# Patient Record
Sex: Male | Born: 1975 | Race: Black or African American | Hispanic: No | Marital: Married | Smoking: Never smoker
Health system: Southern US, Community
[De-identification: ages and names within clinical notes are randomized; demographics above are authoritative.]

## PROBLEM LIST (undated history)

## (undated) DIAGNOSIS — K589 Irritable bowel syndrome without diarrhea: Secondary | ICD-10-CM

---

## 2018-04-15 ENCOUNTER — Ambulatory Visit (INDEPENDENT_AMBULATORY_CARE_PROVIDER_SITE_OTHER): Payer: 59 | Admitting: Family Medicine

## 2018-04-15 ENCOUNTER — Encounter: Payer: Self-pay | Admitting: Family Medicine

## 2018-04-15 VITALS — BP 123/78 | HR 79 | Ht 73.0 in | Wt 185.0 lb

## 2018-04-15 DIAGNOSIS — M25512 Pain in left shoulder: Secondary | ICD-10-CM

## 2018-04-15 MED ORDER — DICLOFENAC SODIUM 75 MG PO TBEC: 75.0000 mg | DELAYED_RELEASE_TABLET | Freq: Two times a day (BID) | ORAL | 1 refills | Status: AC

## 2018-04-15 NOTE — Patient Instructions (Signed)
You have rotator cuff impingement Try to avoid painful activities (overhead activities, lifting with extended arm) as much as possible. Diclofenac twice a day with food for pain and inflammation. Can take tylenol in addition to this. Subacromial injection may be beneficial to help with pain and to decrease inflammation. Consider physical therapy with transition to home exercise program. Do home exercise program with theraband and scapular stabilization exercises daily 3 sets of 10 once a day. If not improving at follow-up we will consider imaging, injection, physical therapy, and/or nitro patches. Follow up with me in 1 month but call me sooner if you're struggling and want to try physical therapy or injection.

## 2018-04-17 ENCOUNTER — Encounter: Payer: Self-pay | Admitting: Family Medicine

## 2018-04-17 NOTE — Progress Notes (Signed)
PCP: System, Pcp Not In  Subjective:   HPI: Patient is a 43 y.o. male here for left shoulder pain.  Patient reports he's had about 3 weeks of fairly severe left lateral shoulder pain. Pain radiates down into his upper arm. Pain level 5-6/10 and sharp. He is right handed. Thinks it may have started after working out but didn't have an acute injury. Had shots of toradol and decadron at urgent care that helped for a day then pain returned. Tried PO toradol without much benefit. No skin changes, numbness, neck pain  History reviewed. No pertinent past medical history.  Current Outpatient Medications on File Prior to Visit  Medication Sig Dispense Refill  . mometasone-formoterol (DULERA) 100-5 MCG/ACT AERO Inhale into the lungs.     No current facility-administered medications on file prior to visit.     History reviewed. No pertinent surgical history.  No Known Allergies  Social History   Socioeconomic History  . Marital status: Married    Spouse name: Not on file  . Number of children: Not on file  . Years of education: Not on file  . Highest education level: Not on file  Occupational History  . Not on file  Social Needs  . Financial resource strain: Not on file  . Food insecurity:    Worry: Not on file    Inability: Not on file  . Transportation needs:    Medical: Not on file    Non-medical: Not on file  Tobacco Use  . Smoking status: Never Smoker  . Smokeless tobacco: Never Used  Substance and Sexual Activity  . Alcohol use: Not on file  . Drug use: Not on file  . Sexual activity: Not on file  Lifestyle  . Physical activity:    Days per week: Not on file    Minutes per session: Not on file  . Stress: Not on file  Relationships  . Social connections:    Talks on phone: Not on file    Gets together: Not on file    Attends religious service: Not on file    Active member of club or organization: Not on file    Attends meetings of clubs or organizations: Not  on file    Relationship status: Not on file  . Intimate partner violence:    Fear of current or ex partner: Not on file    Emotionally abused: Not on file    Physically abused: Not on file    Forced sexual activity: Not on file  Other Topics Concern  . Not on file  Social History Narrative  . Not on file    History reviewed. No pertinent family history.  BP 123/78   Pulse 79   Ht 6\' 1"  (1.854 m)   Wt 185 lb (83.9 kg)   BMI 24.41 kg/m   Review of Systems: See HPI above.     Objective:  Physical Exam:  Gen: NAD, comfortable in exam room  Left shoulder: No swelling, ecchymoses.  No gross deformity. No TTP. FROM with painful arc Positive Juanetta GoslingHawkins, Neers. Negative Yergasons. Strength 5/5 with empty can and resisted internal/external rotation.  Pain empty can. Negative apprehension. NV intact distally.  Right shoulder: No swelling, ecchymoses.  No gross deformity. No TTP. FROM. Strength 5/5 with empty can and resisted internal/external rotation. NV intact distally.   Assessment & Plan:  1. Left shoulder pain - 2/2 rotator cuff impingement.  Discussed options - he would like to start with diclofenac and home  exercise program which was reviewed today.  Tylenol if needed.  Consider imaging, injection, physical therapy, nitro patches if not improving.  F/u in 1 month.

## 2018-04-22 ENCOUNTER — Encounter

## 2018-04-22 ENCOUNTER — Ambulatory Visit: Payer: Self-pay | Admitting: Family Medicine

## 2018-05-18 ENCOUNTER — Ambulatory Visit (INDEPENDENT_AMBULATORY_CARE_PROVIDER_SITE_OTHER): Payer: 59 | Admitting: Family Medicine

## 2018-05-18 ENCOUNTER — Encounter: Payer: Self-pay | Admitting: Family Medicine

## 2018-05-18 VITALS — BP 114/78 | HR 71 | Ht 73.0 in | Wt 185.0 lb

## 2018-05-18 DIAGNOSIS — M25512 Pain in left shoulder: Secondary | ICD-10-CM

## 2018-05-18 NOTE — Progress Notes (Signed)
PCP: System, Pcp Not In  Subjective:   HPI: Patient is a 43 y.o. male here for left shoulder pain.  1/15: Patient reports he's had about 3 weeks of fairly severe left lateral shoulder pain. Pain radiates down into his upper arm. Pain level 5-6/10 and sharp. He is right handed. Thinks it may have started after working out but didn't have an acute injury. Had shots of toradol and decadron at urgent care that helped for a day then pain returned. Tried PO toradol without much benefit. No skin changes, numbness, neck pain  2/17: Patient returns for left shoulder pain.  Following his previous visit, he had been taking the diclofenac twice daily and doing rehab exercises.  He had been using the diclofenac for about 10 days before weaning off the medication.  He reports having no pain at that time.  Then, about 1 week ago his pain returned.  He today reports 5/10 lateral left shoulder pain radiates down the outside the arm.  He feels an aching pain at rest which is worse with use.  He does have pain with laying on his left side.  He denies any numbness or tingling.  No erythema or bruising.  No swelling.  No new injury.  No past medical history on file.  Current Outpatient Medications on File Prior to Visit  Medication Sig Dispense Refill  . diclofenac (VOLTAREN) 75 MG EC tablet Take 1 tablet (75 mg total) by mouth 2 (two) times daily. 60 tablet 1  . mometasone-formoterol (DULERA) 100-5 MCG/ACT AERO Inhale into the lungs.     No current facility-administered medications on file prior to visit.     No past surgical history on file.  No Known Allergies  Social History   Socioeconomic History  . Marital status: Married    Spouse name: Not on file  . Number of children: Not on file  . Years of education: Not on file  . Highest education level: Not on file  Occupational History  . Not on file  Social Needs  . Financial resource strain: Not on file  . Food insecurity:    Worry: Not  on file    Inability: Not on file  . Transportation needs:    Medical: Not on file    Non-medical: Not on file  Tobacco Use  . Smoking status: Never Smoker  . Smokeless tobacco: Never Used  Substance and Sexual Activity  . Alcohol use: Not on file  . Drug use: Not on file  . Sexual activity: Not on file  Lifestyle  . Physical activity:    Days per week: Not on file    Minutes per session: Not on file  . Stress: Not on file  Relationships  . Social connections:    Talks on phone: Not on file    Gets together: Not on file    Attends religious service: Not on file    Active member of club or organization: Not on file    Attends meetings of clubs or organizations: Not on file    Relationship status: Not on file  . Intimate partner violence:    Fear of current or ex partner: Not on file    Emotionally abused: Not on file    Physically abused: Not on file    Forced sexual activity: Not on file  Other Topics Concern  . Not on file  Social History Narrative  . Not on file    No family history on file.  BP  114/78   Pulse 71   Ht 6\' 1"  (1.854 m)   Wt 185 lb (83.9 kg)   BMI 24.41 kg/m   Review of Systems: See HPI above.     Objective:  Physical Exam:  GEN: Awake, alert, no acute distress Pulmonary: Breathing unlabored  Left shoulder: No obvious deformity or asymmetry. No bruising. No swelling No TTP. Specifically no TTP over [facility] joint Full ROM in flexion, abduction, internal/external rotation.  Slight pain with overhead movement NV intact distally Special Tests:  - Impingement: Only slight pain with Hawkins.  Negative Neers.  - Supraspinatus: Negative empty can.  5/5 strength - Infraspinatus/Teres: 5/5 strength with ER - Subscapularis: 5/5 strength with IR - Biceps tendon: Negative Speeds. - Labrum: Negative Obriens.   Right shoulder: No obvious deformity No TTP Full ROM in flexion, abduction, internal/external rotation NV intact distally 5/5 strength with  RTC testing   Assessment & Plan:  1.  Left shoulder pain secondary to rotator cuff impingement versus tendinopathy - he responded well to initial treatments with diclofenac and home exercises but pain returned. -Diclofenac 75 mg twice daily 6 -Continue home rehab - will follow up in by the end of the week via phone. If not improving, consider NG patch, vs PT referral vs steroid injection

## 2018-05-18 NOTE — Patient Instructions (Signed)
You have rotator cuff impingement Try to avoid painful activities (overhead activities, lifting with extended arm) as much as possible. Diclofenac twice a day with food for pain and inflammation. Can take tylenol in addition to this. Do home exercise program with theraband and scapular stabilization exercises daily 3 sets of 10 once a day. Call me at the end of the week if this isn't getting better - would consider nitro patches, physical therapy, or injection as next step. Otherwise follow up with me as needed if you improve.

## 2018-05-22 ENCOUNTER — Telehealth: Payer: Self-pay | Admitting: Family Medicine

## 2018-05-22 MED ORDER — NITROGLYCERIN 0.2 MG/HR TD PT24: MEDICATED_PATCH | TRANSDERMAL | 1 refills | Status: AC

## 2018-05-22 NOTE — Telephone Encounter (Signed)
He should use 1/4th of a patch, change it daily as we had discussed.  Expect him to use it either for 6 weeks or 3 months - this is how long most patients need to use it for.

## 2018-05-22 NOTE — Telephone Encounter (Signed)
Patient would like to try the Nitro patch and asked how long he should use this for?   Preferred pharmacy: [facility] [employer] on [address]

## 2018-05-22 NOTE — Telephone Encounter (Signed)
Patient informed and verbalized understanding. Will call if he has any questions.

## 2018-08-17 ENCOUNTER — Emergency Department (HOSPITAL_BASED_OUTPATIENT_CLINIC_OR_DEPARTMENT_OTHER)
Admission: EM | Admit: 2018-08-17 | Discharge: 2018-08-17 | Disposition: A | Discharge status: Home / Self Care | Payer: 59 | Attending: Emergency Medicine | Admitting: Emergency Medicine

## 2018-08-17 ENCOUNTER — Encounter (HOSPITAL_BASED_OUTPATIENT_CLINIC_OR_DEPARTMENT_OTHER): Payer: Self-pay | Admitting: Emergency Medicine

## 2018-08-17 ENCOUNTER — Other Ambulatory Visit: Payer: Self-pay

## 2018-08-17 DIAGNOSIS — R112 Nausea with vomiting, unspecified: Secondary | ICD-10-CM | POA: Diagnosis not present

## 2018-08-17 DIAGNOSIS — R109 Unspecified abdominal pain: Secondary | ICD-10-CM | POA: Diagnosis present

## 2018-08-17 DIAGNOSIS — R079 Chest pain, unspecified: Secondary | ICD-10-CM | POA: Diagnosis not present

## 2018-08-17 DIAGNOSIS — Z79899 Other long term (current) drug therapy: Secondary | ICD-10-CM | POA: Diagnosis not present

## 2018-08-17 DIAGNOSIS — R197 Diarrhea, unspecified: Secondary | ICD-10-CM | POA: Diagnosis not present

## 2018-08-17 LAB — CBC WITH DIFFERENTIAL/PLATELET
Abs Immature Granulocytes: 0.07 10*3/uL (ref 0.00–0.07)
Basophils Absolute: 0 10*3/uL (ref 0.0–0.1)
Basophils Relative: 0 %
Eosinophils Absolute: 0.2 10*3/uL (ref 0.0–0.5)
Eosinophils Relative: 2 %
HCT: 37.8 % — ABNORMAL LOW (ref 39.0–52.0)
Hemoglobin: 12.3 g/dL — ABNORMAL LOW (ref 13.0–17.0)
Immature Granulocytes: 1 %
Lymphocytes Relative: 17 %
Lymphs Abs: 1.6 10*3/uL (ref 0.7–4.0)
MCH: 29.1 pg (ref 26.0–34.0)
MCHC: 32.5 g/dL (ref 30.0–36.0)
MCV: 89.4 fL (ref 80.0–100.0)
Monocytes Absolute: 0.9 10*3/uL (ref 0.1–1.0)
Monocytes Relative: 9 %
Neutro Abs: 6.7 10*3/uL (ref 1.7–7.7)
Neutrophils Relative %: 71 %
Platelets: 297 10*3/uL (ref 150–400)
RBC: 4.23 MIL/uL (ref 4.22–5.81)
RDW: 11.9 % (ref 11.5–15.5)
WBC: 9.5 10*3/uL (ref 4.0–10.5)
nRBC: 0 % (ref 0.0–0.2)

## 2018-08-17 LAB — COMPREHENSIVE METABOLIC PANEL
ALT: 12 U/L (ref 0–44)
AST: 16 U/L (ref 15–41)
Albumin: 3.6 g/dL (ref 3.5–5.0)
Alkaline Phosphatase: 39 U/L (ref 38–126)
Anion gap: 10 (ref 5–15)
BUN: 8 mg/dL (ref 6–20)
CO2: 26 mmol/L (ref 22–32)
Calcium: 8.7 mg/dL — ABNORMAL LOW (ref 8.9–10.3)
Chloride: 102 mmol/L (ref 98–111)
Creatinine, Ser: 1.24 mg/dL (ref 0.61–1.24)
GFR calc Af Amer: >60 mL/min (ref >60)
GFR calc non Af Amer: >60 mL/min (ref >60)
Glucose, Bld: 93 mg/dL (ref 70–99)
Potassium: 3.6 mmol/L (ref 3.5–5.1)
Sodium: 138 mmol/L (ref 135–145)
Total Bilirubin: 0.6 mg/dL (ref 0.3–1.2)
Total Protein: 7.3 g/dL (ref 6.5–8.1)

## 2018-08-17 LAB — LIPASE, BLOOD: Lipase: 19 U/L (ref 11–51)

## 2018-08-17 MED ORDER — SODIUM CHLORIDE 0.9 % IV BOLUS
1000.0000 mL | Freq: Once | INTRAVENOUS | Status: AC
  Administered 2018-08-17: 11:00:00 1000 mL via INTRAVENOUS

## 2018-08-17 NOTE — ED Triage Notes (Signed)
Pt reports abd pain x 1 week,  Emesis x 2 days , Hx indigestion. Diarrhea after meals.

## 2018-08-17 NOTE — ED Provider Notes (Signed)
[facility] EMERGENCY DEPARTMENT Provider Note   CSN: [id number] Arrival date & time: 08/17/18  1036    History   Chief Complaint Chief Complaint  Patient presents with  . Abdominal Pain    HPI Earl Caldwell is a 43 y.o. male.     HPI Patient presents with around just under 2 weeks of diarrhea.  States that whenever he eats something he gets episode of diarrhea.  States around 10 days ago he had some nausea and vomiting but now just the diarrhea.  No sick contacts.  States that time will have some chest pain with it.  No fevers.  No chills.  Did have some irritable bowel with diarrhea in the past but states he had been good for around 2 years.  He had changed his diet and really had no more diarrhea. History reviewed. No pertinent past medical history.  There are no active problems to display for this patient.   History reviewed. No pertinent surgical history.      Home Medications    Prior to Admission medications   Medication Sig Start Date End Date Taking? Authorizing Provider  diclofenac (VOLTAREN) 75 MG EC tablet Take 1 tablet (75 mg total) by mouth 2 (two) times daily. 04/15/18   Hudnall, Azucena Fallen, MD  mometasone-formoterol (DULERA) 100-5 MCG/ACT AERO Inhale into the lungs. 01/05/17   [provider]  nitroGLYCERIN (NITRODUR - DOSED IN MG/24 HR) 0.2 mg/hr patch Apply 1/4th patch to affected area, change daily 05/22/18   Hudnall, Azucena Fallen, MD    Family History No family history on file.  Social History Social History   Tobacco Use  . Smoking status: Never Smoker  . Smokeless tobacco: Never Used  Substance Use Topics  . Alcohol use: Not on file  . Drug use: Not on file     Allergies   Patient has no known allergies.   Review of Systems Review of Systems  Constitutional: Positive for appetite change.  HENT: Negative for congestion.   Respiratory: Negative for shortness of breath.   Cardiovascular: Negative for chest pain.   Gastrointestinal: Positive for diarrhea and nausea. Negative for abdominal pain.  Genitourinary: Negative for flank pain.  Musculoskeletal: Negative for back pain.  Neurological: Positive for weakness.  Psychiatric/Behavioral: Negative for confusion.     Physical Exam Updated Vital Signs BP 102/71   Pulse 81   Temp 98.6 F (37 C) (Oral)   Resp 18   SpO2 100%   Physical Exam Vitals signs and nursing note reviewed.  HENT:     Head: Normocephalic.  Cardiovascular:     Rate and Rhythm: Normal rate.  Pulmonary:     Effort: Pulmonary effort is normal.  Abdominal:     Tenderness: There is no abdominal tenderness.  Skin:    General: Skin is warm.     Capillary Refill: Capillary refill takes less than 2 seconds.  Neurological:     General: No focal deficit present.     Mental Status: He is alert.      ED Treatments / Results  Labs (all labs ordered are listed, but only abnormal results are displayed) Labs Reviewed  COMPREHENSIVE METABOLIC PANEL - Abnormal; Notable for the following components:      Result Value   Calcium 8.7 (*)    All other components within normal limits  CBC WITH DIFFERENTIAL/PLATELET - Abnormal; Notable for the following components:   Hemoglobin 12.3 (*)    HCT 37.8 (*)    All  other components within normal limits  LIPASE, BLOOD    EKG None  Radiology No results found.  Procedures Procedures (including critical care time)  Medications Ordered in ED Medications  sodium chloride 0.9 % bolus 1,000 mL (0 mLs Intravenous Stopped 08/17/18 1233)     Initial Impression / Assessment and Plan / ED Course  I have reviewed the triage vital signs and the nursing notes.  Pertinent labs & imaging results that were available during my care of the patient were reviewed by me and considered in my medical decision making (see chart for details).        Patient with diarrhea over the last couple weeks.  Labs reassuring.  Previous irritable bowel.   Discussed with patient about potential CT scan but at this point we both decided to defer for further outpatient work-up.  Appears overall well hydrated but have been given some fluid.  Given GI follow-up here if needed as he is is both spending time here and in [place].  Discharge home  Final Clinical Impressions(s) / ED Diagnoses   Final diagnoses:  Diarrhea, unspecified type    ED Discharge Orders    None       Benjiman CorePickering, Luiza Carranco, MD 08/17/18 1541

## 2018-08-20 ENCOUNTER — Emergency Department (HOSPITAL_COMMUNITY): Payer: 59

## 2018-08-20 ENCOUNTER — Encounter (HOSPITAL_COMMUNITY): Payer: Self-pay

## 2018-08-20 ENCOUNTER — Emergency Department (HOSPITAL_COMMUNITY)
Admission: EM | Admit: 2018-08-20 | Discharge: 2018-08-20 | Disposition: A | Discharge status: Home / Self Care | Payer: 59 | Attending: Emergency Medicine | Admitting: Emergency Medicine

## 2018-08-20 ENCOUNTER — Other Ambulatory Visit: Payer: Self-pay

## 2018-08-20 DIAGNOSIS — R1032 Left lower quadrant pain: Secondary | ICD-10-CM | POA: Diagnosis not present

## 2018-08-20 DIAGNOSIS — R109 Unspecified abdominal pain: Secondary | ICD-10-CM

## 2018-08-20 DIAGNOSIS — R197 Diarrhea, unspecified: Secondary | ICD-10-CM | POA: Diagnosis present

## 2018-08-20 HISTORY — DX: Irritable bowel syndrome without diarrhea: K58.9

## 2018-08-20 LAB — COMPREHENSIVE METABOLIC PANEL
ALT: 14 U/L (ref 0–44)
AST: 19 U/L (ref 15–41)
Albumin: 3.6 g/dL (ref 3.5–5.0)
Alkaline Phosphatase: 42 U/L (ref 38–126)
Anion gap: 9 (ref 5–15)
BUN: 6 mg/dL (ref 6–20)
CO2: 27 mmol/L (ref 22–32)
Calcium: 8.6 mg/dL — ABNORMAL LOW (ref 8.9–10.3)
Chloride: 102 mmol/L (ref 98–111)
Creatinine, Ser: 1.25 mg/dL — ABNORMAL HIGH (ref 0.61–1.24)
GFR calc Af Amer: >60 mL/min (ref >60)
GFR calc non Af Amer: >60 mL/min (ref >60)
Glucose, Bld: 92 mg/dL (ref 70–99)
Potassium: 3.4 mmol/L — ABNORMAL LOW (ref 3.5–5.1)
Sodium: 138 mmol/L (ref 135–145)
Total Bilirubin: 0.2 mg/dL — ABNORMAL LOW (ref 0.3–1.2)
Total Protein: 7.1 g/dL (ref 6.5–8.1)

## 2018-08-20 LAB — CBC
HCT: 36.8 % — ABNORMAL LOW (ref 39.0–52.0)
Hemoglobin: 12.4 g/dL — ABNORMAL LOW (ref 13.0–17.0)
MCH: 30.3 pg (ref 26.0–34.0)
MCHC: 33.7 g/dL (ref 30.0–36.0)
MCV: 90 fL (ref 80.0–100.0)
Platelets: 305 10*3/uL (ref 150–400)
RBC: 4.09 MIL/uL — ABNORMAL LOW (ref 4.22–5.81)
RDW: 12.2 % (ref 11.5–15.5)
WBC: 8.8 10*3/uL (ref 4.0–10.5)
nRBC: 0 % (ref 0.0–0.2)

## 2018-08-20 LAB — LIPASE, BLOOD: Lipase: 17 U/L (ref 11–51)

## 2018-08-20 MED ORDER — SODIUM CHLORIDE 0.9 % IV SOLN: INTRAVENOUS | Status: DC

## 2018-08-20 MED ORDER — SODIUM CHLORIDE 0.9 % IV BOLUS
1000.0000 mL | Freq: Once | INTRAVENOUS | Status: AC
  Administered 2018-08-20: 12:00:00 1000 mL via INTRAVENOUS

## 2018-08-20 MED ORDER — DICYCLOMINE HCL 20 MG PO TABS: 20.0000 mg | ORAL_TABLET | Freq: Two times a day (BID) | ORAL | 0 refills | Status: AC

## 2018-08-20 MED ORDER — SODIUM CHLORIDE 0.9% FLUSH
3.0000 mL | Freq: Once | INTRAVENOUS | Status: AC
  Administered 2018-08-20: 3 mL via INTRAVENOUS

## 2018-08-20 MED ORDER — IOHEXOL 300 MG/ML  SOLN
100.0000 mL | Freq: Once | INTRAMUSCULAR | Status: AC | PRN
  Administered 2018-08-20: 14:00:00 100 mL via INTRAVENOUS

## 2018-08-20 MED ORDER — SODIUM CHLORIDE (PF) 0.9 % IJ SOLN
INTRAMUSCULAR | Status: AC
  Filled 2018-08-20: qty 50

## 2018-08-20 NOTE — ED Triage Notes (Signed)
Patient c/o left mid abdominal pain, nausea, and diarrhea x 9 days. Patient states he went to an UC x 2 and received Diphenoxylate and Cipro. patient states "it did not work."

## 2018-08-20 NOTE — ED Provider Notes (Signed)
[facility]-EMERGENCY DEPT Provider Note   CSN: [id number] Arrival date & time: 08/20/18  1136    History   Chief Complaint Chief Complaint  Patient presents with  . Abdominal Pain  . Nausea  . Diarrhea    HPI Earl Caldwell is a 43 y.o. male.     43 year old male presents with 2 weeks of diarrhea that is made worse after he eats.  No fevers or chills.  Diarrhea is characterizes loose stools without blood.  Denies any recent antibiotics or travel history.  Patient works from home.  Seen here recently for similar symptoms lab work was reassuring.  Does have a prior history of IBS.  Denies any recent constipation.  No urinary symptoms.  Nothing makes his symptoms better     Past Medical History:  Diagnosis Date  . IBS (irritable bowel syndrome)     There are no active problems to display for this patient.   History reviewed. No pertinent surgical history.      Home Medications    Prior to Admission medications   Medication Sig Start Date End Date Taking? Authorizing Provider  diclofenac (VOLTAREN) 75 MG EC tablet Take 1 tablet (75 mg total) by mouth 2 (two) times daily. 04/15/18   Hudnall, Azucena Fallen, MD  mometasone-formoterol (DULERA) 100-5 MCG/ACT AERO Inhale into the lungs. 01/05/17   [provider]  nitroGLYCERIN (NITRODUR - DOSED IN MG/24 HR) 0.2 mg/hr patch Apply 1/4th patch to affected area, change daily 05/22/18   Hudnall, Azucena Fallen, MD    Family History History reviewed. No pertinent family history.  Social History Social History   Tobacco Use  . Smoking status: Never Smoker  . Smokeless tobacco: Never Used  Substance Use Topics  . Alcohol use: Never    Frequency: Never  . Drug use: Never     Allergies   Patient has no known allergies.   Review of Systems Review of Systems  All other systems reviewed and are negative.    Physical Exam Updated Vital Signs BP 120/78 (BP Location: Right Arm)   Pulse 95   Temp 98.5  F (36.9 C) (Oral)   Resp 18   Ht 1.854 m (6\' 1" )   Wt 83.9 kg   SpO2 97%   BMI 24.41 kg/m   Physical Exam Vitals signs and nursing note reviewed.  Constitutional:      General: He is not in acute distress.    Appearance: Normal appearance. He is well-developed. He is not toxic-appearing.  HENT:     Head: Normocephalic and atraumatic.  Eyes:     General: Lids are normal.     Conjunctiva/sclera: Conjunctivae normal.     Pupils: Pupils are equal, round, and reactive to light.  Neck:     Musculoskeletal: Normal range of motion and neck supple.     Thyroid: No thyroid mass.     Trachea: No tracheal deviation.  Cardiovascular:     Rate and Rhythm: Normal rate and regular rhythm.     Heart sounds: Normal heart sounds. No murmur. No gallop.   Pulmonary:     Effort: Pulmonary effort is normal. No respiratory distress.     Breath sounds: Normal breath sounds. No stridor. No decreased breath sounds, wheezing, rhonchi or rales.  Abdominal:     General: Bowel sounds are normal. There is no distension.     Palpations: Abdomen is soft.     Tenderness: There is abdominal tenderness in the left lower quadrant. There  is no rebound.    Musculoskeletal: Normal range of motion.        General: No tenderness.  Skin:    General: Skin is warm and dry.     Findings: No abrasion or rash.  Neurological:     Mental Status: He is alert and oriented to person, place, and time.     GCS: GCS eye subscore is 4. GCS verbal subscore is 5. GCS motor subscore is 6.     Cranial Nerves: No cranial nerve deficit.     Sensory: No sensory deficit.  Psychiatric:        Speech: Speech normal.        Behavior: Behavior normal.      ED Treatments / Results  Labs (all labs ordered are listed, but only abnormal results are displayed) Labs Reviewed  LIPASE, BLOOD  COMPREHENSIVE METABOLIC PANEL  CBC  URINALYSIS, ROUTINE W REFLEX MICROSCOPIC    EKG None  Radiology No results found.  Procedures  Procedures (including critical care time)  Medications Ordered in ED Medications  sodium chloride flush (NS) 0.9 % injection 3 mL (has no administration in time range)  sodium chloride 0.9 % bolus 1,000 mL (has no administration in time range)  0.9 %  sodium chloride infusion (has no administration in time range)     Initial Impression / Assessment and Plan / ED Course  I have reviewed the triage vital signs and the nursing notes.  Pertinent labs & imaging results that were available during my care of the patient were reviewed by me and considered in my medical decision making (see chart for details).        She given IV fluids here and abdominal CT without acute findings.  Suspect IBS as etiology of his symptoms.  Give GI referral and place patient on [facility]  Final Clinical Impressions(s) / ED Diagnoses   Final diagnoses:  None    ED Discharge Orders    None       Lorre NickAllen, Darrelle Wiberg, MD 08/20/18 1525

## 2019-06-30 ENCOUNTER — Ambulatory Visit (INDEPENDENT_AMBULATORY_CARE_PROVIDER_SITE_OTHER): Payer: 59 | Admitting: Family Medicine

## 2019-06-30 ENCOUNTER — Ambulatory Visit: Payer: Self-pay

## 2019-06-30 ENCOUNTER — Other Ambulatory Visit: Payer: Self-pay

## 2019-06-30 VITALS — BP 112/60 | Ht 73.0 in | Wt 185.0 lb

## 2019-06-30 DIAGNOSIS — M545 Low back pain, unspecified: Secondary | ICD-10-CM

## 2019-06-30 DIAGNOSIS — M7541 Impingement syndrome of right shoulder: Secondary | ICD-10-CM | POA: Diagnosis not present

## 2019-06-30 DIAGNOSIS — M25511 Pain in right shoulder: Secondary | ICD-10-CM

## 2019-06-30 MED ORDER — PREDNISONE 5 MG PO TABS: ORAL_TABLET | ORAL | 0 refills | Status: AC

## 2019-06-30 NOTE — Assessment & Plan Note (Signed)
Findings on ultrasound are suggestive of impingement.  Less likely for joint disease.  Possibly a component of radiculopathy with symptoms going down his arm. -Prednisone. -Counseled on home exercise therapy and supportive care. -Could consider imaging physical therapy or injection.

## 2019-06-30 NOTE — Assessment & Plan Note (Signed)
He has instability with dynamic testing on the right side.  Seems to have some sort of imbalance or weakness with that right side lower back and pelvic region. -Counseled on home exercise therapy and supportive care. -Could consider imaging or physical therapy.

## 2019-06-30 NOTE — Progress Notes (Signed)
Earl Caldwell - 44 y.o. male MRN [id number]  Date of birth: [birth year]  SUBJECTIVE:  Including CC & ROS.  No chief complaint on file.   Earl Caldwell is a 44 y.o. male that is presenting with right shoulder pain and low back pain.  The shoulder pain is been ongoing for a few weeks.  He experiences the pain over the lateral aspect as well as some radicular type symptoms.  No history of surgery.  No history of inciting event.  Seems to be worse with certain movements and abduction.  Having low back pain is been ongoing.  Seems to be localized to the right side.  No radicular symptoms.  No history of surgery.   Review of Systems See HPI   HISTORY: Past Medical, Surgical, Social, and Family History Reviewed & Updated per EMR.   Pertinent Historical Findings include:  Past Medical History:  Diagnosis Date  . IBS (irritable bowel syndrome)     No past surgical history on file.  No family history on file.  Social History   Socioeconomic History  . Marital status: Married    Spouse name: Not on file  . Number of children: Not on file  . Years of education: Not on file  . Highest education level: Not on file  Occupational History  . Not on file  Tobacco Use  . Smoking status: Never Smoker  . Smokeless tobacco: Never Used  Substance and Sexual Activity  . Alcohol use: Never  . Drug use: Never  . Sexual activity: Not on file  Other Topics Concern  . Not on file  Social History Narrative  . Not on file   Social Determinants of Health   Financial Resource Strain:   . Difficulty of Paying Living Expenses:   Food Insecurity:   . Worried About Programme researcher, broadcasting/film/video in the Last Year:   . Barista in the Last Year:   Transportation Needs:   . Freight forwarder (Medical):   Marland Kitchen Lack of Transportation (Non-Medical):   Physical Activity:   . Days of Exercise per Week:   . Minutes of Exercise per Session:   Stress:   . Feeling of Stress :   Social Connections:   .  Frequency of Communication with Friends and Family:   . Frequency of Social Gatherings with Friends and Family:   . Attends Religious Services:   . Active Member of Clubs or Organizations:   . Attends Banker Meetings:   Marland Kitchen Marital Status:   Intimate Partner Violence:   . Fear of Current or Ex-Partner:   . Emotionally Abused:   Marland Kitchen Physically Abused:   . Sexually Abused:      PHYSICAL EXAM:  VS: BP 112/60   Ht 6\' 1"  (1.854 m)   Wt 185 lb (83.9 kg)   BMI 24.41 kg/m  Physical Exam Gen: NAD, alert, cooperative with exam, well-appearing MSK:  Right shoulder: Normal internal and external rotation. Normal flexion and abduction. Normal strength resistance. Normal empty can test. Normal speeds test. Normal O'Brien's test. Back: Normal strength resistance with hip flexion. Normal internal and external rotation of the hips. Instability with 1 leg standing on the right as well as external rotation abduction on the right. Negative straight leg raise. Normal flexion extension. Neurovascularly intact  Limited ultrasound: Right shoulder:  Normal-appearing biceps tendon in long and short axis. Normal subscapularis. Supraspinatus with mild hypoechoic changes which would suggest more of a chronic issue.  Some impingement  noted on dynamic testing. Normal posterior glenohumeral joint.  Summary: Findings would suggest impingement syndrome.  Ultrasound and interpretation by Clearance Coots, MD      ASSESSMENT & PLAN:   Shoulder impingement syndrome, right Findings on ultrasound are suggestive of impingement.  Less likely for joint disease.  Possibly a component of radiculopathy with symptoms going down his arm. -Prednisone. -Counseled on home exercise therapy and supportive care. -Could consider imaging physical therapy or injection.  Acute right-sided low back pain without sciatica He has instability with dynamic testing on the right side.  Seems to have some sort of  imbalance or weakness with that right side lower back and pelvic region. -Counseled on home exercise therapy and supportive care. -Could consider imaging or physical therapy.

## 2019-06-30 NOTE — Patient Instructions (Signed)
Nice to meet you  Please try the prednisone. You can take the duexis after the prednisone as needed  Please try heat on the lower back  Please try ice on the shoulder  Please try the exercises  Please send me a message in MyChart with any questions or updates.  Please see me back in 4-6 weeks.   --Dr. Jordan Likes

## 2019-07-09 ENCOUNTER — Telehealth: Payer: Self-pay | Admitting: Family Medicine

## 2019-07-09 NOTE — Telephone Encounter (Signed)
Patient has been taking duexis and it is causing him stomach discomfort. He is asking if there is an alternative medication that can be prescribed

## 2019-07-12 MED ORDER — CELECOXIB 200 MG PO CAPS: ORAL_CAPSULE | ORAL | 1 refills | Status: AC

## 2019-07-12 NOTE — Telephone Encounter (Signed)
Didn't tolerate duexis. Will provide celebrex.   Myra Rude, MD [facility] 07/12/2019, 11:49 AM

## 2019-08-03 ENCOUNTER — Ambulatory Visit (INDEPENDENT_AMBULATORY_CARE_PROVIDER_SITE_OTHER): Payer: 59 | Admitting: Family Medicine

## 2019-08-03 ENCOUNTER — Other Ambulatory Visit: Payer: Self-pay

## 2019-08-03 VITALS — BP 122/80 | Ht 72.5 in | Wt 185.0 lb

## 2019-08-03 DIAGNOSIS — M545 Low back pain, unspecified: Secondary | ICD-10-CM

## 2019-08-03 DIAGNOSIS — M5412 Radiculopathy, cervical region: Secondary | ICD-10-CM

## 2019-08-03 NOTE — Progress Notes (Signed)
Earl Caldwell - 44 y.o. male MRN [id number]  Date of birth: 03/19/1976  SUBJECTIVE:  Including CC & ROS.  No chief complaint on file.   Earl Caldwell is a 44 y.o. male that is following up for his right shoulder/arm pain.  He has been taking Celebrex and unsure if it helps.  He feels the symptoms in the shoulder but also down by the hand.  He denies any severe pain.  It is intermittent in nature.  Has been ongoing for several months now.   Review of Systems See HPI   HISTORY: Past Medical, Surgical, Social, and Family History Reviewed & Updated per EMR.   Pertinent Historical Findings include:  Past Medical History:  Diagnosis Date  . IBS (irritable bowel syndrome)     No past surgical history on file.  No family history on file.  Social History   Socioeconomic History  . Marital status: Married    Spouse name: Not on file  . Number of children: Not on file  . Years of education: Not on file  . Highest education level: Not on file  Occupational History  . Not on file  Tobacco Use  . Smoking status: Never Smoker  . Smokeless tobacco: Never Used  Substance and Sexual Activity  . Alcohol use: Never  . Drug use: Never  . Sexual activity: Not on file  Other Topics Concern  . Not on file  Social History Narrative  . Not on file   Social Determinants of Health   Financial Resource Strain:   . Difficulty of Paying Living Expenses:   Food Insecurity:   . Worried About Programme researcher, broadcasting/film/video in the Last Year:   . Barista in the Last Year:   Transportation Needs:   . Freight forwarder (Medical):   Marland Kitchen Lack of Transportation (Non-Medical):   Physical Activity:   . Days of Exercise per Week:   . Minutes of Exercise per Session:   Stress:   . Feeling of Stress :   Social Connections:   . Frequency of Communication with Friends and Family:   . Frequency of Social Gatherings with Friends and Family:   . Attends Religious Services:   . Active Member of Clubs or  Organizations:   . Attends Banker Meetings:   Marland Kitchen Marital Status:   Intimate Partner Violence:   . Fear of Current or Ex-Partner:   . Emotionally Abused:   Marland Kitchen Physically Abused:   . Sexually Abused:      PHYSICAL EXAM:  VS: BP 122/80   Ht 6' 0.5" (1.842 m)   Wt 185 lb (83.9 kg)   BMI 24.75 kg/m  Physical Exam Gen: NAD, alert, cooperative with exam, well-appearing MSK:  Neck, right shoulder: Normal range of motion. Normal strength resistance with shrug. Normal shoulder range of motion. Normal internal and external rotation. No signs of atrophy. Neurovascular intact     ASSESSMENT & PLAN:   Cervical radiculopathy Symptoms may be more radicular nature as opposed to impingement of the shoulder.  He has been taken Celebrex unsure if he notices a difference. -Counseled on home exercise therapy and supportive care. -Referral to physical therapy. -Stop Celebrex.  If his pain worsens then will restart it. -If no improvement physical therapy.  Would consider imaging and possible MRI for epidural.  Acute right-sided low back pain without sciatica Pain is mild in nature.  Is ongoing. -Counseled on home exercise therapy and supportive care. -Referral to physical  therapy.

## 2019-08-03 NOTE — Assessment & Plan Note (Signed)
Symptoms may be more radicular nature as opposed to impingement of the shoulder.  He has been taken Celebrex unsure if he notices a difference. -Counseled on home exercise therapy and supportive care. -Referral to physical therapy. -Stop Celebrex.  If his pain worsens then will restart it. -If no improvement physical therapy.  Would consider imaging and possible MRI for epidural.

## 2019-08-03 NOTE — Assessment & Plan Note (Signed)
Pain is mild in nature.  Is ongoing. -Counseled on home exercise therapy and supportive care. -Referral to physical therapy.

## 2019-08-03 NOTE — Patient Instructions (Signed)
Good to see you Physical therapy will give you a call  You can stop the celebrex. If your pain gets worse then I can send more in. Please let me know how you are doing after a couple of sessions of physical therapy.   Please send me a message in MyChart with any questions or updates.  Please see me back in 4-6 weeks or depending if we order imaging.   --Dr. Jordan Likes

## 2019-08-05 ENCOUNTER — Telehealth: Payer: Self-pay | Admitting: Family Medicine

## 2019-08-05 DIAGNOSIS — M545 Low back pain, unspecified: Secondary | ICD-10-CM

## 2019-08-05 DIAGNOSIS — M5412 Radiculopathy, cervical region: Secondary | ICD-10-CM

## 2019-08-05 NOTE — Telephone Encounter (Signed)
Patient called to update that he has decided to go through with physical therapy. He will be going to therapy three times a week for four to six weeks.

## 2019-08-06 NOTE — Telephone Encounter (Signed)
Placed referral to physical therapy.   Myra Rude, MD [facility] 08/06/2019, 8:42 AM

## 2019-08-11 ENCOUNTER — Ambulatory Visit: Payer: 59 | Admitting: Family Medicine

## 2019-09-06 ENCOUNTER — Other Ambulatory Visit: Payer: Self-pay

## 2019-09-06 ENCOUNTER — Encounter: Payer: Self-pay | Admitting: Family Medicine

## 2019-09-06 ENCOUNTER — Ambulatory Visit (INDEPENDENT_AMBULATORY_CARE_PROVIDER_SITE_OTHER): Payer: 59 | Admitting: Family Medicine

## 2019-09-06 DIAGNOSIS — M5412 Radiculopathy, cervical region: Secondary | ICD-10-CM | POA: Diagnosis not present

## 2019-09-06 DIAGNOSIS — M545 Low back pain, unspecified: Secondary | ICD-10-CM

## 2019-09-06 NOTE — Progress Notes (Signed)
Earl Caldwell - 44 y.o. male MRN [id number]  Date of birth: 1975/10/01  SUBJECTIVE:  Including CC & ROS.  Chief Complaint  Patient presents with  . Follow-up    neck    Earl Caldwell is a 44 y.o. male that is following up for his right arm pain and right lower back pain.  He feels the changes in his arm have improved since physical therapy.  This started just the past week.  He is able to do things without noticing any of the altered or pain sensations.  He still feels weak in the right arm as he has been going to the gym.  He still intermittently feels pain over the lateral aspect of the hip as well as the lower back.  This is only occurring when he is in full flexion and bending over.   Review of Systems See HPI   HISTORY: Past Medical, Surgical, Social, and Family History Reviewed & Updated per EMR.   Pertinent Historical Findings include:  Past Medical History:  Diagnosis Date  . IBS (irritable bowel syndrome)     No past surgical history on file.  No family history on file.  Social History   Socioeconomic History  . Marital status: Married    Spouse name: Not on file  . Number of children: Not on file  . Years of education: Not on file  . Highest education level: Not on file  Occupational History  . Not on file  Tobacco Use  . Smoking status: Never Smoker  . Smokeless tobacco: Never Used  Substance and Sexual Activity  . Alcohol use: Never  . Drug use: Never  . Sexual activity: Not on file  Other Topics Concern  . Not on file  Social History Narrative  . Not on file   Social Determinants of Health   Financial Resource Strain:   . Difficulty of Paying Living Expenses:   Food Insecurity:   . Worried About Charity fundraiser in the Last Year:   . Arboriculturist in the Last Year:   Transportation Needs:   . Film/video editor (Medical):   Marland Kitchen Lack of Transportation (Non-Medical):   Physical Activity:   . Days of Exercise per Week:   . Minutes of  Exercise per Session:   Stress:   . Feeling of Stress :   Social Connections:   . Frequency of Communication with Friends and Family:   . Frequency of Social Gatherings with Friends and Family:   . Attends Religious Services:   . Active Member of Clubs or Organizations:   . Attends Archivist Meetings:   Marland Kitchen Marital Status:   Intimate Partner Violence:   . Fear of Current or Ex-Partner:   . Emotionally Abused:   Marland Kitchen Physically Abused:   . Sexually Abused:      PHYSICAL EXAM:  VS: BP 108/80   Pulse 80   Ht 6\' 1"  (1.854 m)   Wt 185 lb (83.9 kg)   BMI 24.41 kg/m  Physical Exam Gen: NAD, alert, cooperative with exam, well-appearing MSK:  Neck: Normal range of motion. Normal strength resistance. Normal shoulder range of motion on the right. Back: Normal flexion and extension. Normal strength resistance. Neurovascularly intact     ASSESSMENT & PLAN:   Cervical radiculopathy Symptoms have started improving just in the past week with physical therapy.  He has been working out on his own he feels like his right side is weaker than his left. -  Counseled on home exercise therapy and supportive care. -Continue physical therapy. -If symptoms are still present after physical therapy then will consider an MRI   Acute right-sided low back pain without sciatica Having some pain on the right lower lateral side of the back and the right hip.  Unclear if this is more of a hip impingement as opposed to low back pain.  He feels this intermittently with full flexion of the back. -Counseled on home exercise therapy and supportive care. -If ongoing will consider an intra-articular hip injection or greater trochanteric injection.

## 2019-09-06 NOTE — Assessment & Plan Note (Signed)
Having some pain on the right lower lateral side of the back and the right hip.  Unclear if this is more of a hip impingement as opposed to low back pain.  He feels this intermittently with full flexion of the back. -Counseled on home exercise therapy and supportive care. -If ongoing will consider an intra-articular hip injection or greater trochanteric injection.

## 2019-09-06 NOTE — Assessment & Plan Note (Signed)
Symptoms have started improving just in the past week with physical therapy.  He has been working out on his own he feels like his right side is weaker than his left. -Counseled on home exercise therapy and supportive care. -Continue physical therapy. -If symptoms are still present after physical therapy then will consider an MRI

## 2019-09-06 NOTE — Patient Instructions (Signed)
Good to see you Please continue with physical therapy   Please send me a message in MyChart with any questions or updates.  Please send [place] a message a couple weeks after physical therapy and let us know how you're doing. We'll decide the next best step after that.   --Dr. Jordan Likes

## 2019-11-08 ENCOUNTER — Telehealth: Payer: Self-pay | Admitting: Family Medicine

## 2019-11-08 NOTE — Telephone Encounter (Signed)
He is still having pain despite physical therapy.  We will try subacromial injection.  If no improvement will proceed to MRI of the cervical spine to evaluate for nerve impingement.  Myra Rude, MD [facility] 11/08/2019, 5:18 PM

## 2019-11-08 NOTE — Telephone Encounter (Signed)
Patient called states he has completed his Physical therapy as instructed but the Rt shoulder pain has continued----he wonders what is provider's next treatment instructions.  --Forwarding message to Dr. Jordan Likes to contact pt @ [phone] .  -glh

## 2019-11-11 ENCOUNTER — Ambulatory Visit: Payer: Self-pay

## 2019-11-11 ENCOUNTER — Ambulatory Visit (INDEPENDENT_AMBULATORY_CARE_PROVIDER_SITE_OTHER): Payer: 59 | Admitting: Family Medicine

## 2019-11-11 ENCOUNTER — Other Ambulatory Visit: Payer: Self-pay

## 2019-11-11 VITALS — BP 116/66 | Ht 72.5 in | Wt 185.0 lb

## 2019-11-11 DIAGNOSIS — M7541 Impingement syndrome of right shoulder: Secondary | ICD-10-CM

## 2019-11-11 MED ORDER — METHYLPREDNISOLONE ACETATE 40 MG/ML IJ SUSP
40.0000 mg | Freq: Once | INTRAMUSCULAR | Status: AC
  Administered 2019-11-11: 40 mg via INTRA_ARTICULAR

## 2019-11-11 NOTE — Patient Instructions (Signed)
Good to see you Please try ice  Please monitor your pain over the next few hours   Please send me a message in MyChart with any questions or updates.  Please let me know if your pain has improved or not in 1-2 weeks. If not, then we will need to proceed with evaluating the neck.   --Dr. Jordan Likes

## 2019-11-11 NOTE — Progress Notes (Signed)
Earl Caldwell - 44 y.o. male MRN [id number]  Date of birth: 1975-11-04  SUBJECTIVE:  Including CC & ROS.  No chief complaint on file.   Earl Caldwell is a 43 y.o. male that is presenting with ongoing right shoulder pain.  We have tried physical therapy with limited improvement.  He still feels the pain around the shoulder..    Review of Systems See HPI   HISTORY: Past Medical, Surgical, Social, and Family History Reviewed & Updated per EMR.   Pertinent Historical Findings include:  Past Medical History:  Diagnosis Date  . IBS (irritable bowel syndrome)     No past surgical history on file.  No family history on file.  Social History   Socioeconomic History  . Marital status: Married    Spouse name: Not on file  . Number of children: Not on file  . Years of education: Not on file  . Highest education level: Not on file  Occupational History  . Not on file  Tobacco Use  . Smoking status: Never Smoker  . Smokeless tobacco: Never Used  Vaping Use  . Vaping Use: Never used  Substance and Sexual Activity  . Alcohol use: Never  . Drug use: Never  . Sexual activity: Not on file  Other Topics Concern  . Not on file  Social History Narrative  . Not on file   Social Determinants of Health   Financial Resource Strain:   . Difficulty of Paying Living Expenses:   Food Insecurity:   . Worried About Programme researcher, broadcasting/film/video in the Last Year:   . Barista in the Last Year:   Transportation Needs:   . Freight forwarder (Medical):   Marland Kitchen Lack of Transportation (Non-Medical):   Physical Activity:   . Days of Exercise per Week:   . Minutes of Exercise per Session:   Stress:   . Feeling of Stress :   Social Connections:   . Frequency of Communication with Friends and Family:   . Frequency of Social Gatherings with Friends and Family:   . Attends Religious Services:   . Active Member of Clubs or Organizations:   . Attends Banker Meetings:   Marland Kitchen Marital  Status:   Intimate Partner Violence:   . Fear of Current or Ex-Partner:   . Emotionally Abused:   Marland Kitchen Physically Abused:   . Sexually Abused:      PHYSICAL EXAM:  VS: BP 116/66   Ht 6' 0.5" (1.842 m)   Wt 185 lb (83.9 kg)   BMI 24.75 kg/m  Physical Exam Gen: NAD, alert, cooperative with exam, well-appearing MSK:  Right shoulder: Normal active flexion abduction. Normal range of motion. Normal strength resistance. Some pain with abduction. Neurovascular intact   Aspiration/Injection Procedure Note Gareth Fitzner 1976-01-18  Procedure: Injection Indications: Right shoulder pain  Procedure Details Consent: Risks of procedure as well as the alternatives and risks of each were explained to the (patient/caregiver).  Consent for procedure obtained. Time Out: Verified patient identification, verified procedure, site/side was marked, verified correct patient position, special equipment/implants available, medications/allergies/relevent history reviewed, required imaging and test results available.  Performed.  The area was cleaned with iodine and alcohol swabs.    The right subacromial space was injected using 1 cc's of 40 mg Depo-Medrol and 4 cc's of 0.25% bupivacaine with a 22 1 1/2" needle.  Ultrasound was used. Images were obtained in long views showing the injection.     A sterile dressing  was applied.  Patient did tolerate procedure well.     ASSESSMENT & PLAN:   Shoulder impingement syndrome, right Acutely worsening of his pain.  Seems more related to the shoulder at this point.  Could have an underlying radiculopathy origin as well. -Counseled on home exercise therapy and supportive care. -Injection. -May need further work-up the cervical spine if his symptoms are ongoing and no improvement with the injection.

## 2019-11-11 NOTE — Assessment & Plan Note (Signed)
Acutely worsening of his pain.  Seems more related to the shoulder at this point.  Could have an underlying radiculopathy origin as well. -Counseled on home exercise therapy and supportive care. -Injection. -May need further work-up the cervical spine if his symptoms are ongoing and no improvement with the injection.

## 2019-11-18 ENCOUNTER — Other Ambulatory Visit: Payer: Self-pay | Admitting: Family Medicine

## 2019-11-18 ENCOUNTER — Encounter: Payer: Self-pay | Admitting: Family Medicine

## 2019-11-18 DIAGNOSIS — M5412 Radiculopathy, cervical region: Secondary | ICD-10-CM

## 2019-11-18 NOTE — Progress Notes (Signed)
Has ongoing pain despite conservative measures. Will order cervical MRI to evaluate for nerve impingement.   Myra Rude, MD [facility] 11/18/2019, 4:49 PM

## 2019-12-01 ENCOUNTER — Ambulatory Visit
Admission: RE | Admit: 2019-12-01 | Discharge: 2019-12-01 | Disposition: A | Discharge status: Home / Self Care | Payer: 59 | Source: Ambulatory Visit | Attending: Family Medicine | Admitting: Family Medicine

## 2019-12-01 ENCOUNTER — Telehealth: Payer: Self-pay | Admitting: Family Medicine

## 2019-12-01 ENCOUNTER — Other Ambulatory Visit: Payer: Self-pay

## 2019-12-01 DIAGNOSIS — M5412 Radiculopathy, cervical region: Secondary | ICD-10-CM

## 2019-12-01 NOTE — Telephone Encounter (Signed)
Provider to share MRI results w/ pt -- Need to schedule appt -- Left pt msg to call office for scheduling.  --glh

## 2019-12-02 ENCOUNTER — Telehealth (INDEPENDENT_AMBULATORY_CARE_PROVIDER_SITE_OTHER): Payer: 59 | Admitting: Family Medicine

## 2019-12-02 DIAGNOSIS — M5412 Radiculopathy, cervical region: Secondary | ICD-10-CM

## 2019-12-02 NOTE — Assessment & Plan Note (Signed)
Pain is been ongoing and not responding to conservative therapy thus far.  MRI showing different areas of stenosis. -Counseled supportive care. -Epidural.

## 2019-12-02 NOTE — Progress Notes (Signed)
Virtual Visit via Video Note  I connected with Marinell Blight on 12/02/19 at  8:10 AM EDT by a video enabled telemedicine application and verified that I am speaking with the correct person using two identifiers.   I discussed the limitations of evaluation and management by telemedicine and the availability of in person appointments. The patient expressed understanding and agreed to proceed.  Patient: home  Physician: office   History of Present Illness:  Mr. Appling is a 44 year old male that is following up after the MRI of the cervical spine.  This is showing right-sided foraminal stenosis in different levels.  Has been ongoing radicular type symptoms of the right side.  We have tried different injections in therapy with limited improvement.   Observations/Objective:  Gen: NAD, alert, cooperative with exam, well-appearing  Assessment and Plan:  Cervical radiculopathy: Pain is been ongoing and not responding to conservative therapy thus far.  MRI showing different areas of stenosis. -Counseled supportive care. -Epidural.   Follow Up Instructions:    I discussed the assessment and treatment plan with the patient. The patient was provided an opportunity to ask questions and all were answered. The patient agreed with the plan and demonstrated an understanding of the instructions.   The patient was advised to call back or seek an in-person evaluation if the symptoms worsen or if the condition fails to improve as anticipated.   Clare Gandy, MD

## 2019-12-08 ENCOUNTER — Other Ambulatory Visit: Payer: Self-pay

## 2019-12-08 ENCOUNTER — Ambulatory Visit
Admission: RE | Admit: 2019-12-08 | Discharge: 2019-12-08 | Disposition: A | Discharge status: Home / Self Care | Payer: 59 | Source: Ambulatory Visit | Attending: Family Medicine | Admitting: Family Medicine

## 2019-12-08 DIAGNOSIS — M5412 Radiculopathy, cervical region: Secondary | ICD-10-CM

## 2019-12-08 MED ORDER — IOPAMIDOL (ISOVUE-M 300) INJECTION 61%
1.0000 mL | Freq: Once | INTRAMUSCULAR | Status: AC | PRN
  Administered 2019-12-08: 1 mL via EPIDURAL

## 2019-12-08 MED ORDER — TRIAMCINOLONE ACETONIDE 40 MG/ML IJ SUSP (RADIOLOGY)
60.0000 mg | Freq: Once | INTRAMUSCULAR | Status: AC
  Administered 2019-12-08: 60 mg via EPIDURAL

## 2019-12-08 NOTE — Discharge Instructions (Signed)

## 2019-12-09 ENCOUNTER — Other Ambulatory Visit: Payer: 59

## 2019-12-22 ENCOUNTER — Encounter: Payer: Self-pay | Admitting: Family Medicine

## 2019-12-27 ENCOUNTER — Other Ambulatory Visit: Payer: Self-pay | Admitting: Family Medicine

## 2019-12-27 DIAGNOSIS — M5412 Radiculopathy, cervical region: Secondary | ICD-10-CM

## 2020-01-05 ENCOUNTER — Inpatient Hospital Stay: Admission: RE | Admit: 2020-01-05 | Payer: 59 | Source: Ambulatory Visit

## 2020-01-11 ENCOUNTER — Ambulatory Visit
Admission: RE | Admit: 2020-01-11 | Discharge: 2020-01-11 | Disposition: A | Discharge status: Home / Self Care | Payer: 59 | Source: Ambulatory Visit | Attending: Family Medicine | Admitting: Family Medicine

## 2020-01-11 ENCOUNTER — Other Ambulatory Visit: Payer: Self-pay

## 2020-01-11 DIAGNOSIS — M5412 Radiculopathy, cervical region: Secondary | ICD-10-CM

## 2020-01-11 MED ORDER — TRIAMCINOLONE ACETONIDE 40 MG/ML IJ SUSP (RADIOLOGY)
60.0000 mg | Freq: Once | INTRAMUSCULAR | Status: AC
  Administered 2020-01-11: 60 mg via EPIDURAL

## 2020-01-11 MED ORDER — IOPAMIDOL (ISOVUE-M 300) INJECTION 61%
15.0000 mL | Freq: Once | INTRAMUSCULAR | Status: AC | PRN
  Administered 2020-01-11: 15 mL via INTRATHECAL

## 2020-01-11 NOTE — Discharge Instructions (Signed)

## 2020-03-22 ENCOUNTER — Encounter: Payer: Self-pay | Admitting: Family Medicine

## 2022-06-21 IMAGING — MR MR CERVICAL SPINE W/O CM
5 series · 39 of 48 positions shown · non-contrast
Comparison: None.

CLINICAL DATA: Cervical radiculopathy.  Neck and right arm pain.

EXAM:
MRI CERVICAL SPINE WITHOUT CONTRAST
TECHNIQUE: Multiplanar, multisequence MR imaging of the cervical spine was
performed. No intravenous contrast was administered.

[Series 2: T2 · sagittal · 3.0mm · 0.41mm/px · 7 of 17 slices shown (1 of 2)]
[im 1/17]
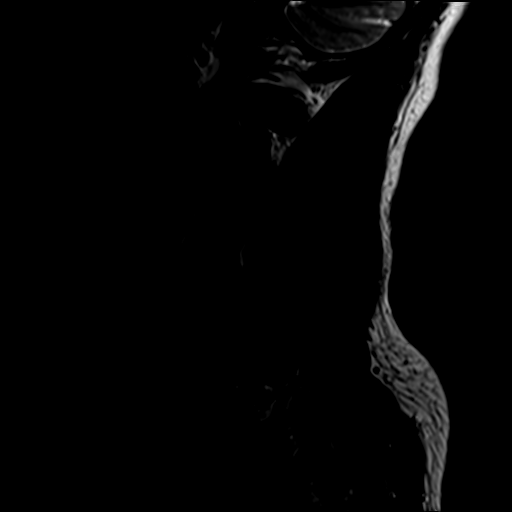
[im 3/17]
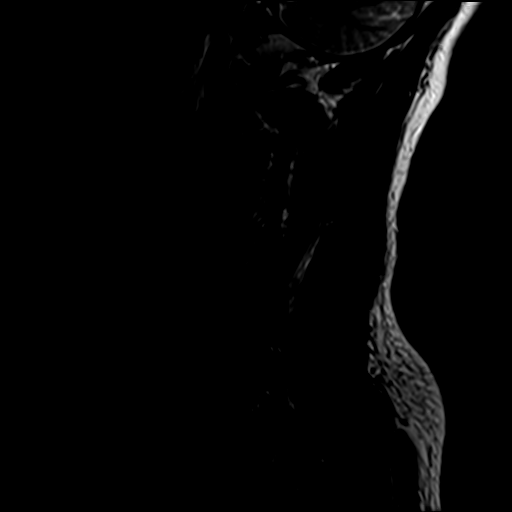
[im 6/17]
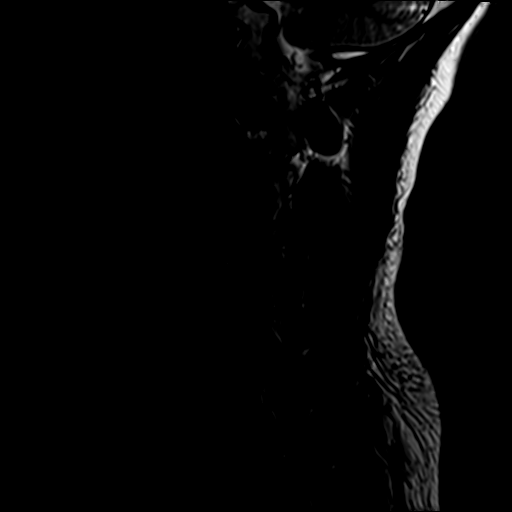
[im 9/17]
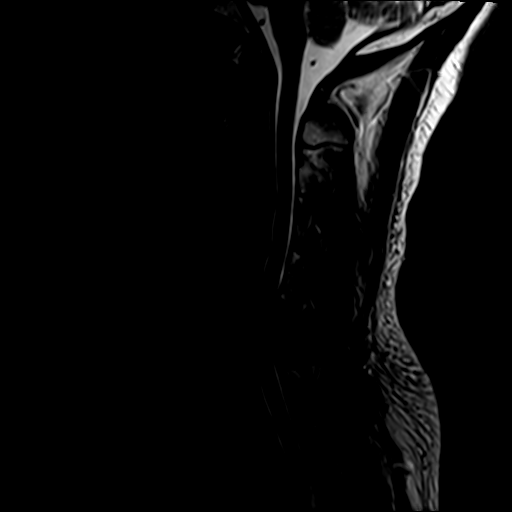
[im 11/17]
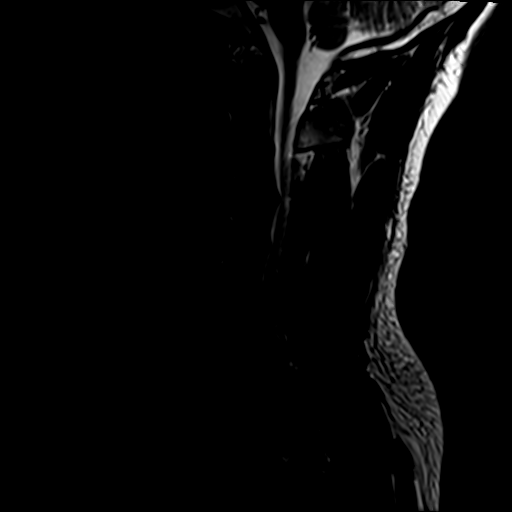
[im 14/17]
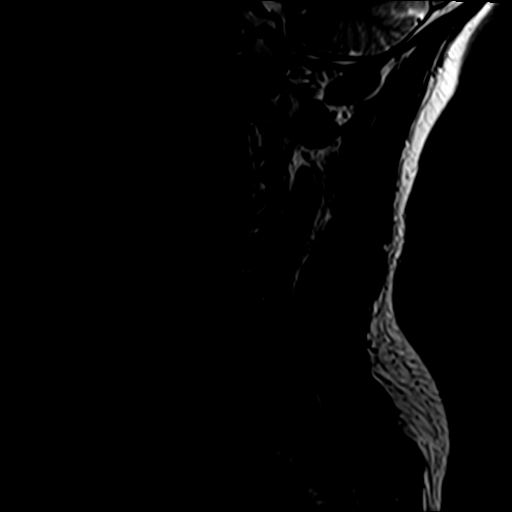
[im 17/17]
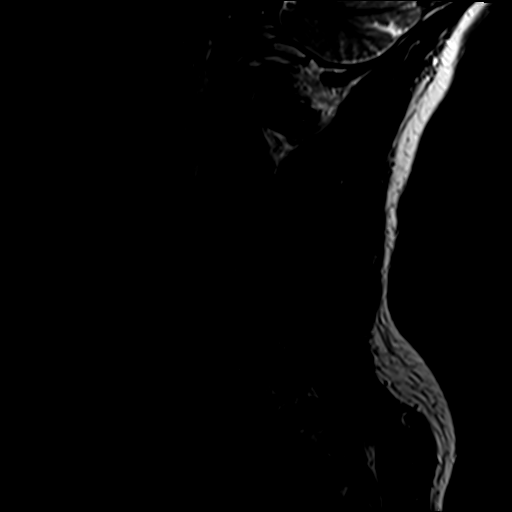

[Series 3: STIR · sagittal · 3.0mm · 0.82mm/px · 8 of 17 slices shown]
[im 1/17]
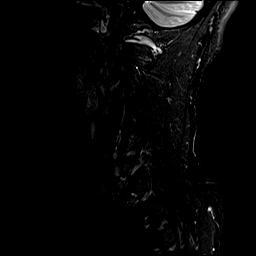
[im 3/17]
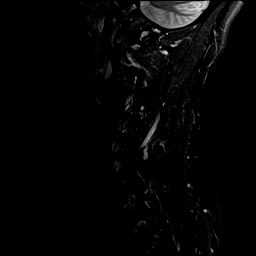
[im 5/17]
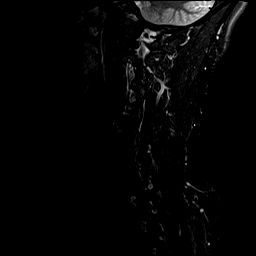
[im 7/17]
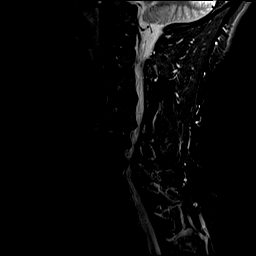
[im 10/17]
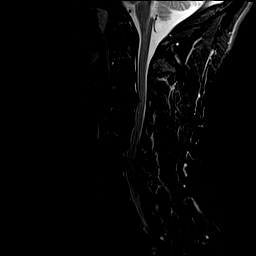
[im 12/17]
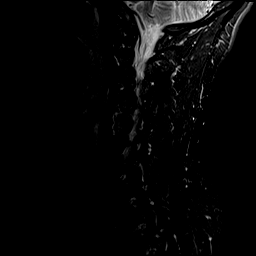
[im 14/17]
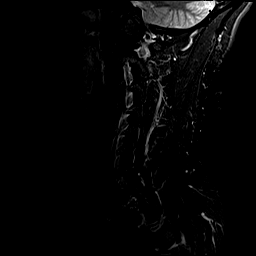
[im 17/17]
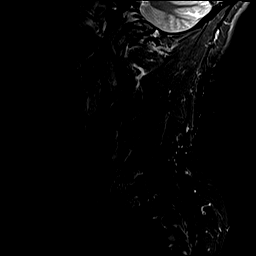

[Series 4: T1 · sagittal · 3.0mm · 0.82mm/px · 8 of 17 slices shown]
[im 1/17]
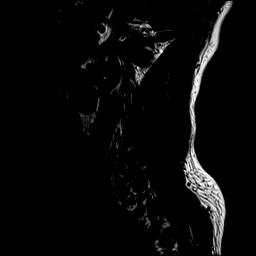
[im 3/17]
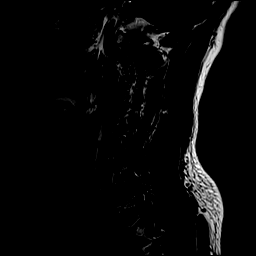
[im 5/17]
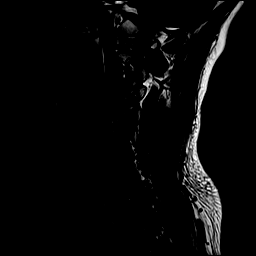
[im 7/17]
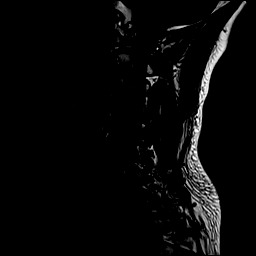
[im 10/17]
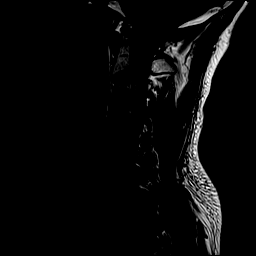
[im 12/17]
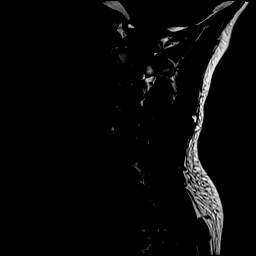
[im 14/17]
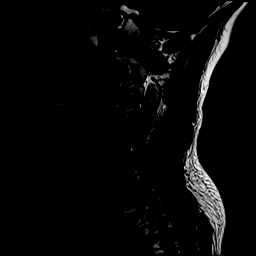
[im 17/17]
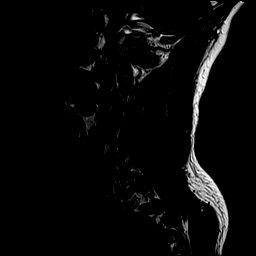

[Series 5: T2 · axial · 3.0mm · 0.70mm/px · z∈[-59,+35]mm · 9 of 27 slices shown (2 of 2)]
[im 1/27]
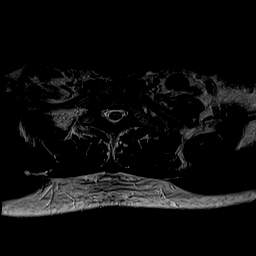
[im 5/27]
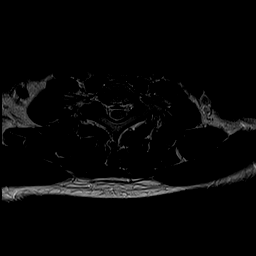
[im 8/27]
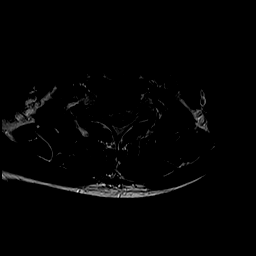
[im 12/27]
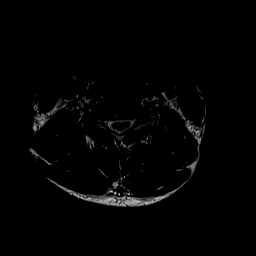
[im 15/27]
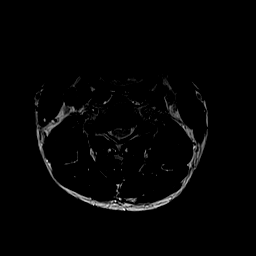
[im 19/27]
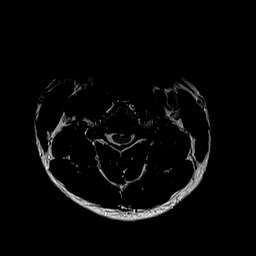
[im 22/27]
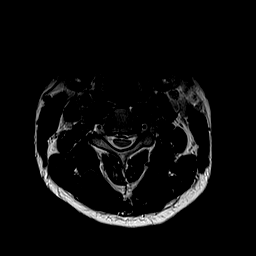
[im 24/27]
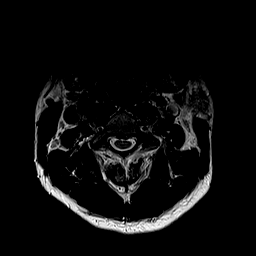
[im 27/27]
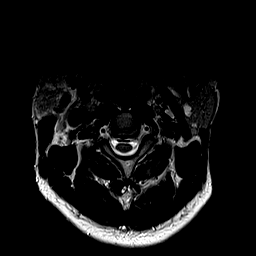

[Series 6: GRE · axial · 3.0mm · 0.47mm/px · z∈[-61,+19]mm · 7 of 28 slices shown]
[im 1/28]
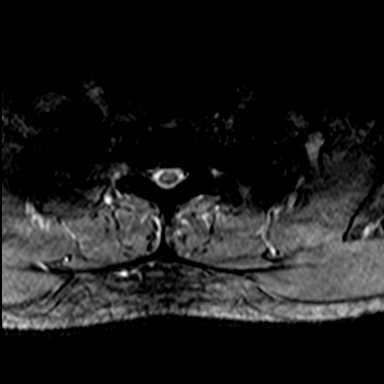
[im 5/28]
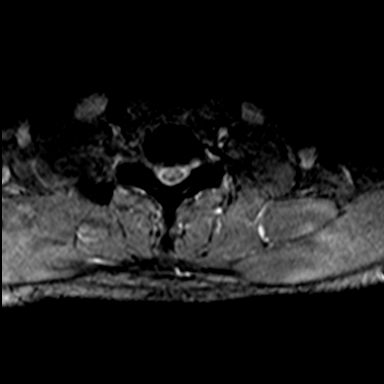
[im 10/28]
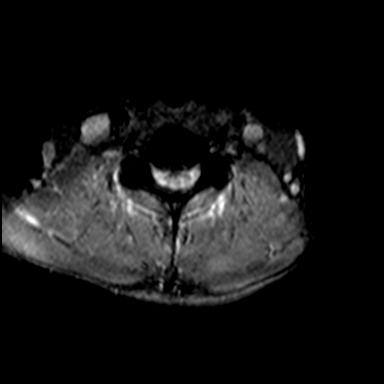
[im 12/28]
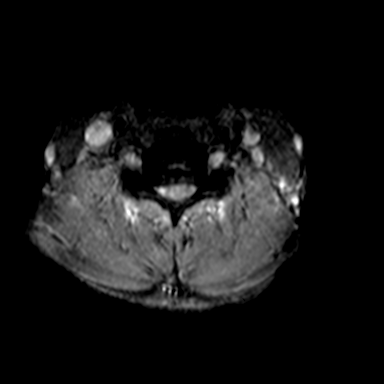
[im 16/28]
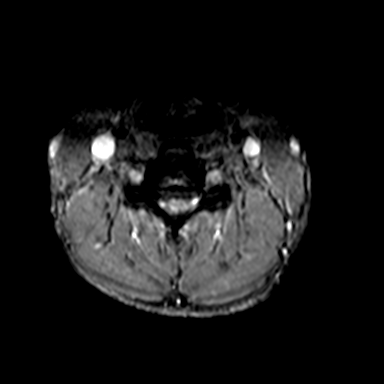
[im 19/28]
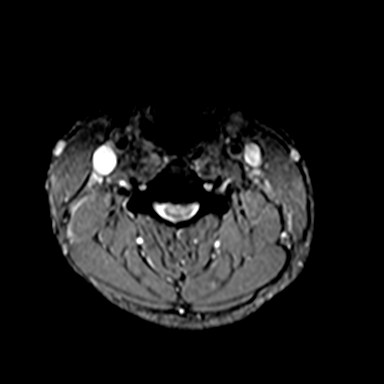
[im 23/28]
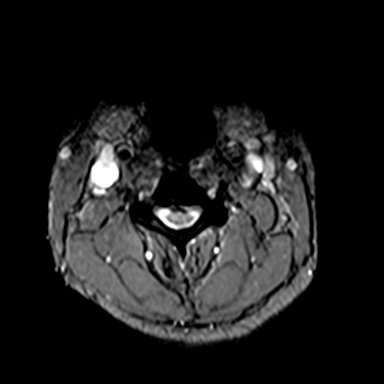

[39 of 48 positions shown; findings below may reference images not displayed]

FINDINGS: The axial gradient echo sequence is severely motion degraded.

Alignment: Reversal of the normal cervical lordosis.  No listhesis.

Vertebrae: No fracture, suspicious osseous lesion, or significant
marrow edema.

Cord: Normal signal.

Posterior Fossa, vertebral arteries, paraspinal tissues:
Unremarkable.

Disc levels:

The cervical spinal canal is mildly small in caliber diffusely on a
congenital basis.

C2-3: Left uncovertebral spurring and mild left facet arthrosis
result in mild left neural foraminal stenosis without spinal
stenosis.

C3-4: Disc bulging and uncovertebral spurring result in mild spinal
stenosis with mild left-sided cord flattening and moderate bilateral
neural foraminal stenosis.

C4-5: Disc bulging and right greater than left uncovertebral
spurring result in mild spinal stenosis and severe right and mild
left neural foraminal stenosis with potential right C5 nerve root
impingement.

C5-6: Disc bulging and right greater than left uncovertebral
spurring result in moderate spinal stenosis and severe right greater
than left neural foraminal stenosis with potential bilateral C6
nerve root impingement.

C6-7: Disc bulging and right greater than left uncovertebral
spurring result in moderate spinal stenosis and severe right and
moderate left neural foraminal stenosis with potential right C7
nerve root impingement.

C7-T1: Mild disc bulging, uncovertebral spurring, and moderate right
and mild left facet arthrosis result in mild right neural foraminal
stenosis without spinal stenosis.
IMPRESSION: 1. Multilevel cervical disc and facet degeneration resulting in
moderate spinal stenosis at C5-6 and C6-7.
2. Severe neural foraminal stenosis at C4-5, C5-6, and C6-7.

## 2022-07-15 ENCOUNTER — Encounter: Payer: Self-pay | Admitting: *Deleted
